# Patient Record
Sex: Female | Born: 1985 | Race: Black or African American | Hispanic: No | Marital: Single
Health system: Southern US, Community
[De-identification: ages and names within clinical notes are randomized; demographics above are authoritative.]

---

## 2016-11-09 ENCOUNTER — Emergency Department
Admission: EM | Admit: 2016-11-09 | Discharge: 2016-11-09 | Disposition: A | Discharge status: Home / Self Care | Payer: Self-pay | Attending: Emergency Medicine | Admitting: Emergency Medicine

## 2016-11-09 ENCOUNTER — Emergency Department: Payer: Self-pay

## 2016-11-09 DIAGNOSIS — Z5321 Procedure and treatment not carried out due to patient leaving prior to being seen by health care provider: Secondary | ICD-10-CM | POA: Insufficient documentation

## 2016-11-09 DIAGNOSIS — R51 Headache: Secondary | ICD-10-CM | POA: Insufficient documentation

## 2016-11-09 DIAGNOSIS — R079 Chest pain, unspecified: Secondary | ICD-10-CM | POA: Insufficient documentation

## 2016-11-09 LAB — CBC
HEMATOCRIT: 36.3 % (ref 35.0–47.0)
HEMOGLOBIN: 12.4 g/dL (ref 12.0–16.0)
MCH: 29.5 pg (ref 26.0–34.0)
MCHC: 34.1 g/dL (ref 32.0–36.0)
MCV: 86.4 fL (ref 80.0–100.0)
Platelets: 243 10*3/uL (ref 150–440)
RBC: 4.2 MIL/uL (ref 3.80–5.20)
RDW: 14.1 % (ref 11.5–14.5)
WBC: 6.6 10*3/uL (ref 3.6–11.0)

## 2016-11-09 LAB — TROPONIN I

## 2016-11-09 LAB — BASIC METABOLIC PANEL
ANION GAP: 5 (ref 5–15)
BUN: 10 mg/dL (ref 6–20)
CALCIUM: 9.5 mg/dL (ref 8.9–10.3)
CHLORIDE: 105 mmol/L (ref 101–111)
CO2: 29 mmol/L (ref 22–32)
Creatinine, Ser: 1.01 mg/dL — ABNORMAL HIGH (ref 0.44–1.00)
GFR calc non Af Amer: >60 mL/min (ref >60)
GLUCOSE: 100 mg/dL — AB (ref 65–99)
POTASSIUM: 4.4 mmol/L (ref 3.5–5.1)
Sodium: 139 mmol/L (ref 135–145)

## 2016-11-09 NOTE — ED Triage Notes (Signed)
Pt has a headache and chest pain for 1 week.  Taking motrin for h/a without relief.  Pt states chest pain in center of chest.  Hx pericarditis .  No sob  Pt alert. Speech clear.

## 2016-11-10 ENCOUNTER — Telehealth: Payer: Self-pay | Admitting: Emergency Medicine

## 2016-11-10 NOTE — Telephone Encounter (Addendum)
Called patient due to lwot to inquire about condition and follow up plans. Left message. Patient called me back.  She continues to have discomfort in chest.  She is visiting here and [place] in another state, but she will be here for a couple more weeks.  I advised she should be seen for exam to see what is causing her pain.

## 2018-07-25 IMAGING — CR DG CHEST 2V
2 series · 2 of 2 positions shown · non-contrast
Comparison: None.

CLINICAL DATA: Acute onset of central chest pain and headache.
Initial encounter.

EXAM:
CHEST  2 VIEW

[chest pa]
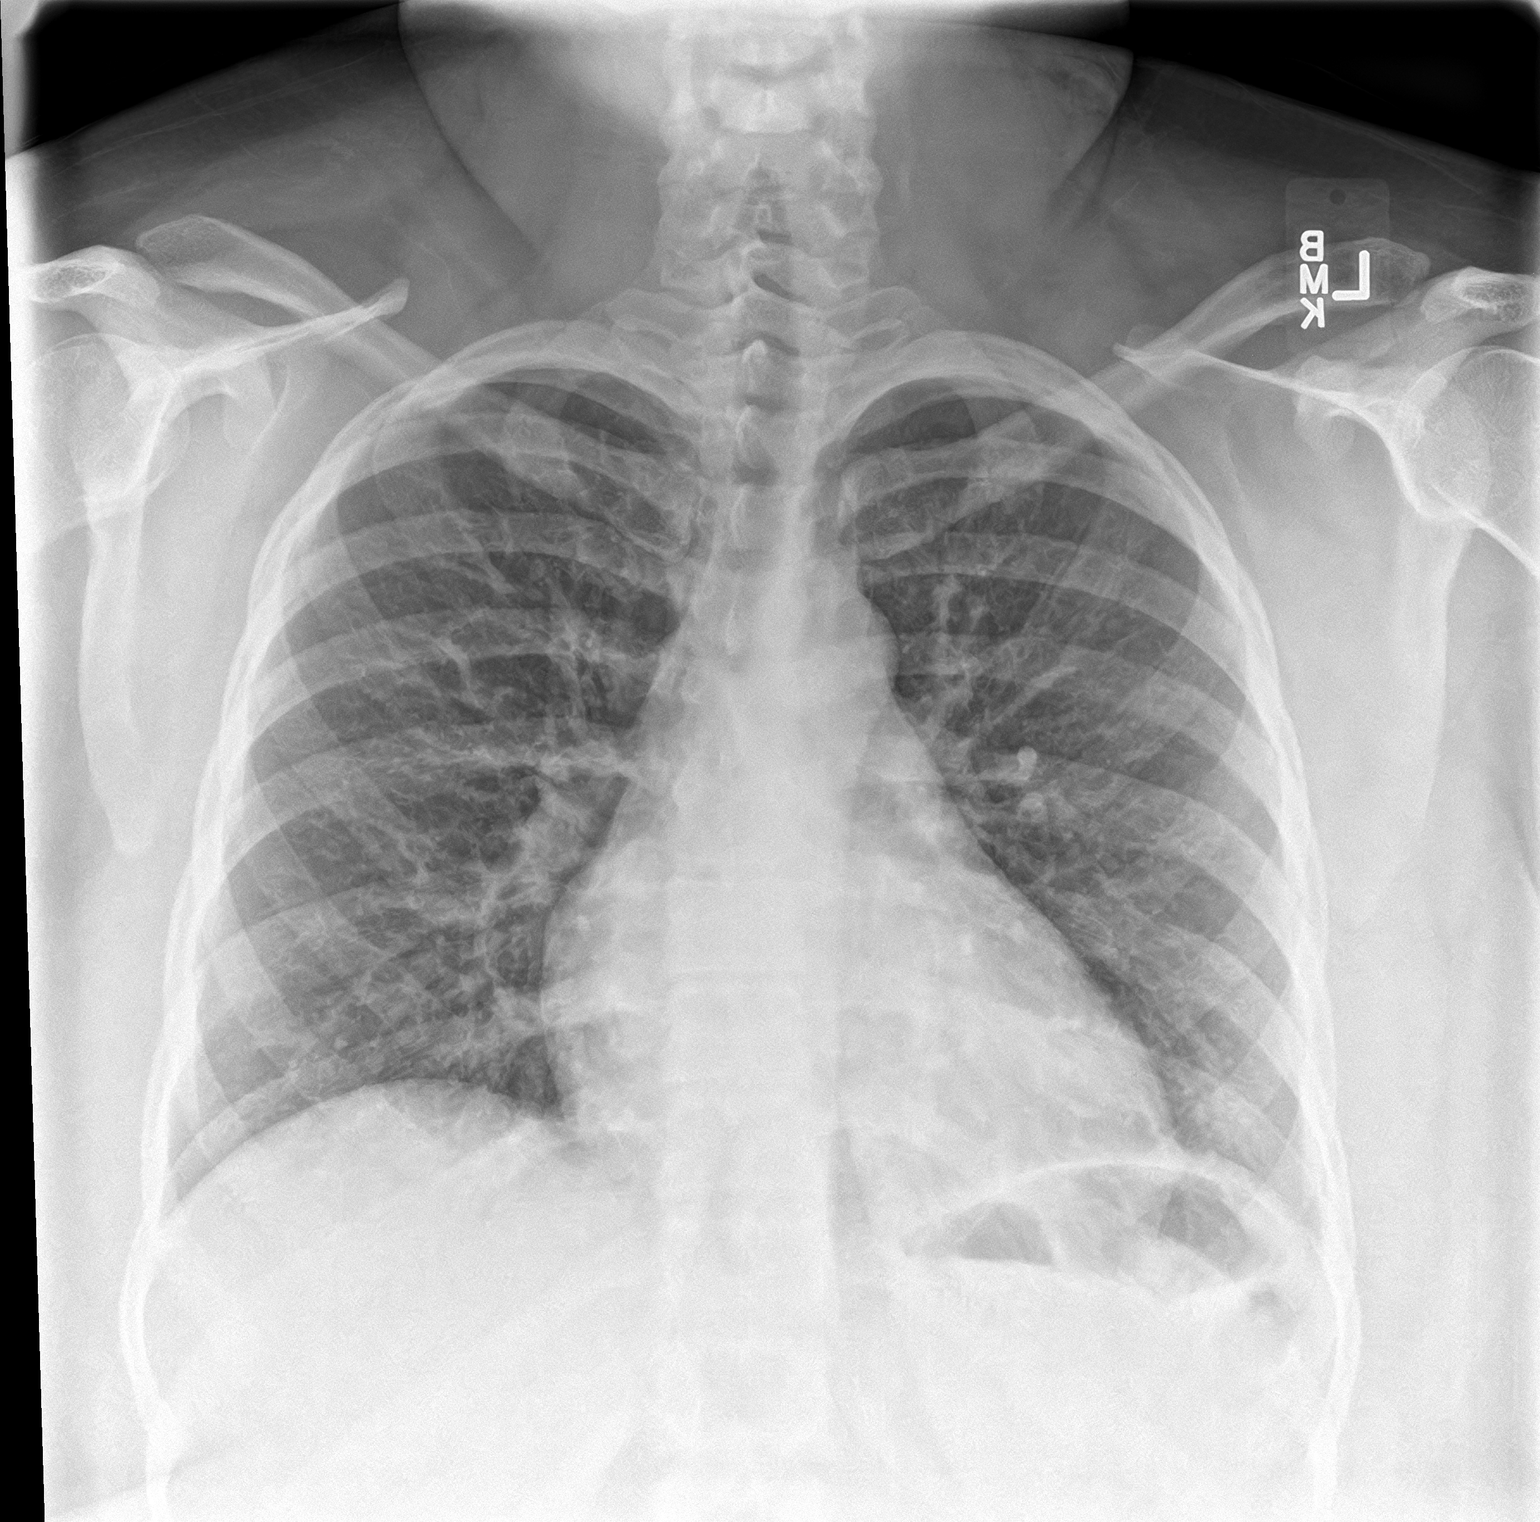

[chest lat]
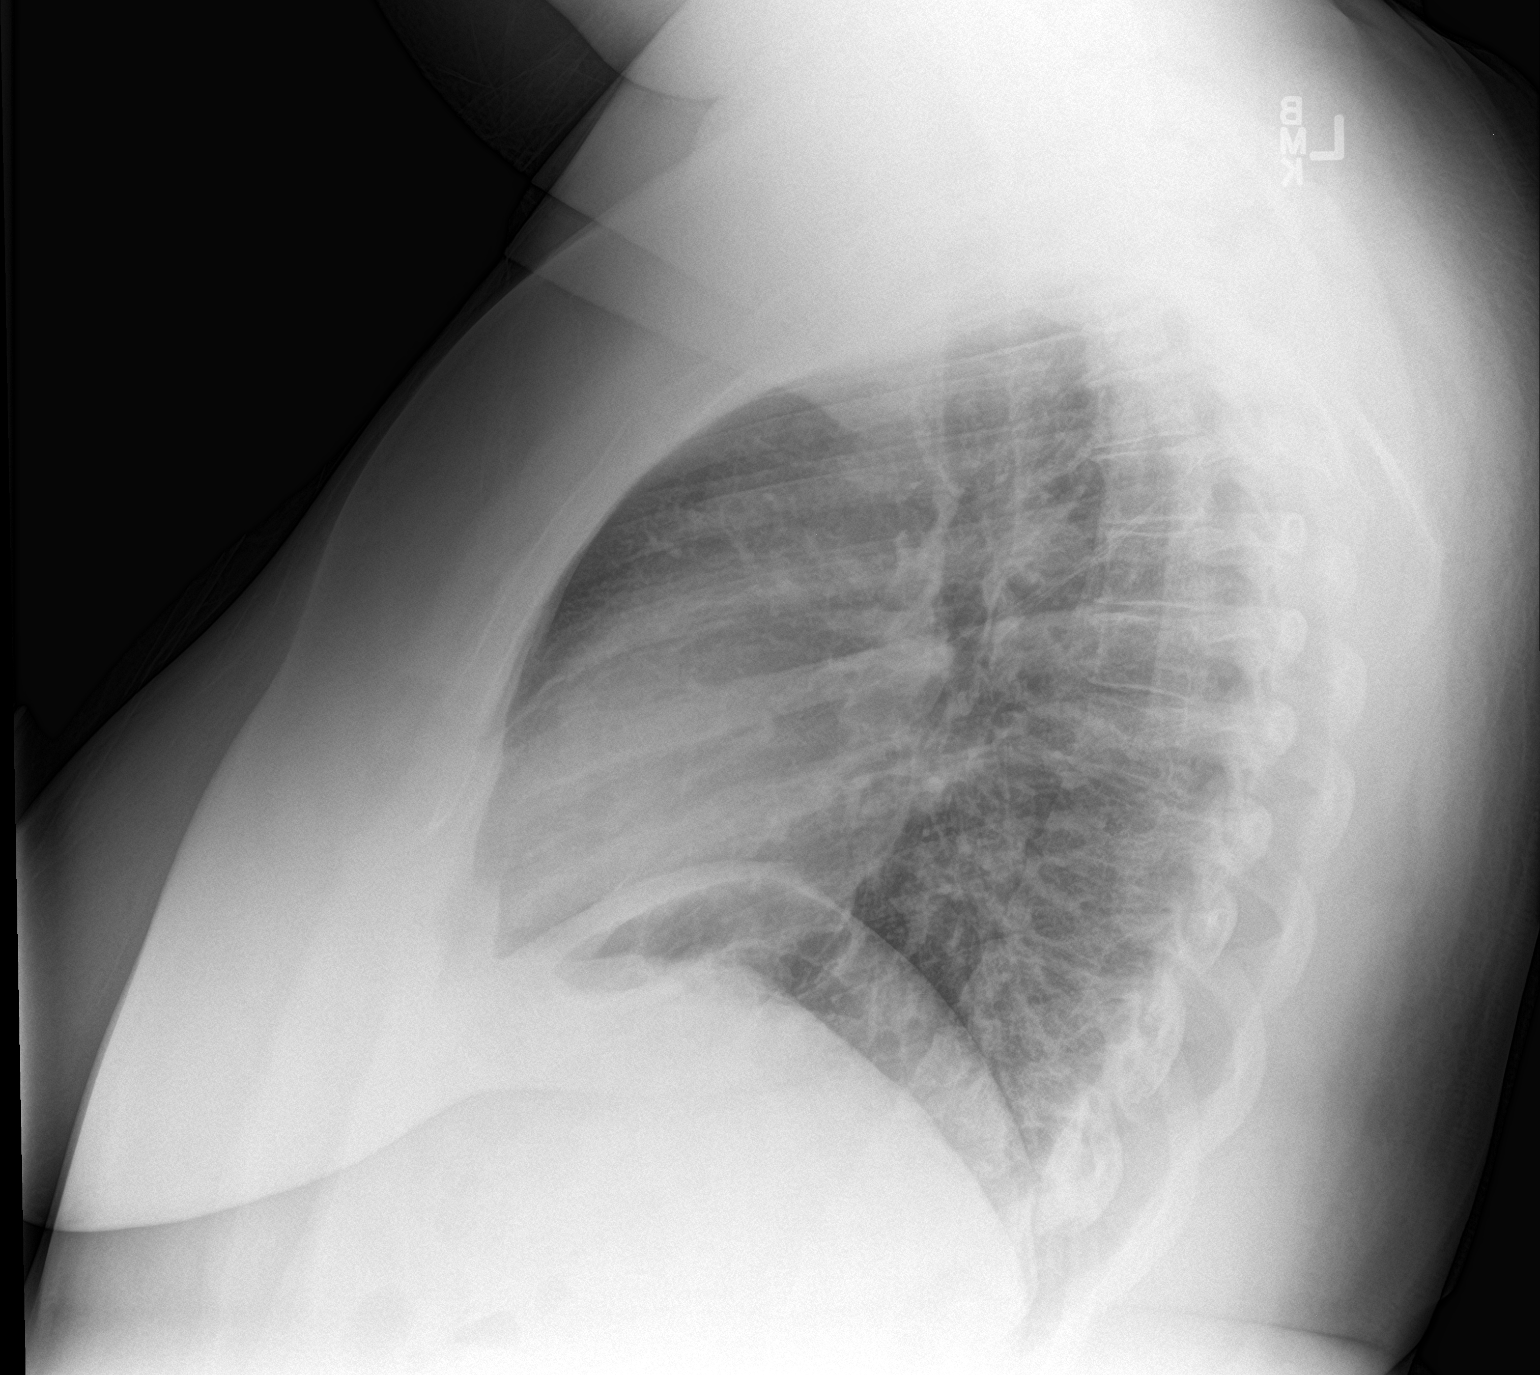

[2 of 2 positions shown; findings below may reference images not displayed]

FINDINGS: The lungs are well-aerated. Mild vascular congestion is noted.
Minimal left basilar atelectasis is noted. There is no evidence of
pleural effusion or pneumothorax.

The heart is normal in size; the mediastinal contour is within
normal limits. No acute osseous abnormalities are seen.
IMPRESSION: Mild vascular congestion noted. Minimal left basilar atelectasis
noted. Lungs otherwise clear.
